# Patient Record
Sex: Female | Born: 1965 | Race: White | Hispanic: No | Marital: Single | Smoking: Never smoker
Health system: Southern US, Community
[De-identification: ages and names within clinical notes are randomized; demographics above are authoritative.]

## PROBLEM LIST (undated history)

## (undated) DIAGNOSIS — F329 Major depressive disorder, single episode, unspecified: Secondary | ICD-10-CM

## (undated) DIAGNOSIS — E079 Disorder of thyroid, unspecified: Secondary | ICD-10-CM

## (undated) DIAGNOSIS — E7800 Pure hypercholesterolemia, unspecified: Secondary | ICD-10-CM

## (undated) DIAGNOSIS — E119 Type 2 diabetes mellitus without complications: Secondary | ICD-10-CM

## (undated) DIAGNOSIS — F32A Depression, unspecified: Secondary | ICD-10-CM

## (undated) HISTORY — PX: BREAST SURGERY: SHX581

## (undated) HISTORY — PX: CHOLECYSTECTOMY: SHX55

---

## 2009-01-27 ENCOUNTER — Ambulatory Visit: Payer: Self-pay | Admitting: Interventional Radiology

## 2009-01-27 ENCOUNTER — Emergency Department (HOSPITAL_BASED_OUTPATIENT_CLINIC_OR_DEPARTMENT_OTHER): Admission: EM | Admit: 2009-01-27 | Discharge: 2009-01-27 | Payer: Self-pay | Admitting: Emergency Medicine

## 2010-12-30 LAB — POCT CARDIAC MARKERS
CKMB, poc: <1 ng/mL — ABNORMAL LOW (ref 1.0–8.0)
Troponin i, poc: <0.05 ng/mL (ref 0.00–0.09)
Troponin i, poc: <0.05 ng/mL (ref 0.00–0.09)

## 2010-12-30 LAB — CBC
HCT: 42 % (ref 36.0–46.0)
MCHC: 33.7 g/dL (ref 30.0–36.0)
MCV: 87.5 fL (ref 78.0–100.0)
Platelets: 274 10*3/uL (ref 150–400)

## 2010-12-30 LAB — BASIC METABOLIC PANEL
BUN: 12 mg/dL (ref 6–23)
CO2: 26 mEq/L (ref 19–32)
Chloride: 107 mEq/L (ref 96–112)
Creatinine, Ser: 0.7 mg/dL (ref 0.4–1.2)
Potassium: 4.1 mEq/L (ref 3.5–5.1)

## 2010-12-30 LAB — DIFFERENTIAL
Basophils Relative: 1 % (ref 0–1)
Eosinophils Absolute: 0.1 10*3/uL (ref 0.0–0.7)
Eosinophils Relative: 1 % (ref 0–5)
Monocytes Relative: 5 % (ref 3–12)
Neutrophils Relative %: 71 % (ref 43–77)

## 2013-11-20 ENCOUNTER — Encounter (HOSPITAL_BASED_OUTPATIENT_CLINIC_OR_DEPARTMENT_OTHER): Payer: Self-pay | Admitting: Emergency Medicine

## 2013-11-20 ENCOUNTER — Emergency Department (HOSPITAL_BASED_OUTPATIENT_CLINIC_OR_DEPARTMENT_OTHER): Payer: Worker's Compensation

## 2013-11-20 ENCOUNTER — Emergency Department (HOSPITAL_BASED_OUTPATIENT_CLINIC_OR_DEPARTMENT_OTHER)
Admission: EM | Admit: 2013-11-20 | Discharge: 2013-11-21 | Disposition: A | Discharge status: Home / Self Care | Payer: Worker's Compensation | Attending: Emergency Medicine | Admitting: Emergency Medicine

## 2013-11-20 DIAGNOSIS — E079 Disorder of thyroid, unspecified: Secondary | ICD-10-CM | POA: Insufficient documentation

## 2013-11-20 DIAGNOSIS — F3289 Other specified depressive episodes: Secondary | ICD-10-CM | POA: Insufficient documentation

## 2013-11-20 DIAGNOSIS — Y9289 Other specified places as the place of occurrence of the external cause: Secondary | ICD-10-CM | POA: Insufficient documentation

## 2013-11-20 DIAGNOSIS — S61411A Laceration without foreign body of right hand, initial encounter: Secondary | ICD-10-CM

## 2013-11-20 DIAGNOSIS — W268XXA Contact with other sharp object(s), not elsewhere classified, initial encounter: Secondary | ICD-10-CM | POA: Insufficient documentation

## 2013-11-20 DIAGNOSIS — F329 Major depressive disorder, single episode, unspecified: Secondary | ICD-10-CM | POA: Insufficient documentation

## 2013-11-20 DIAGNOSIS — Y990 Civilian activity done for income or pay: Secondary | ICD-10-CM | POA: Insufficient documentation

## 2013-11-20 DIAGNOSIS — Z79899 Other long term (current) drug therapy: Secondary | ICD-10-CM | POA: Insufficient documentation

## 2013-11-20 DIAGNOSIS — E7800 Pure hypercholesterolemia, unspecified: Secondary | ICD-10-CM | POA: Insufficient documentation

## 2013-11-20 DIAGNOSIS — Z23 Encounter for immunization: Secondary | ICD-10-CM | POA: Insufficient documentation

## 2013-11-20 DIAGNOSIS — E119 Type 2 diabetes mellitus without complications: Secondary | ICD-10-CM | POA: Insufficient documentation

## 2013-11-20 DIAGNOSIS — Y9389 Activity, other specified: Secondary | ICD-10-CM | POA: Insufficient documentation

## 2013-11-20 DIAGNOSIS — S61409A Unspecified open wound of unspecified hand, initial encounter: Secondary | ICD-10-CM | POA: Insufficient documentation

## 2013-11-20 HISTORY — DX: Major depressive disorder, single episode, unspecified: F32.9

## 2013-11-20 HISTORY — DX: Depression, unspecified: F32.A

## 2013-11-20 HISTORY — DX: Type 2 diabetes mellitus without complications: E11.9

## 2013-11-20 HISTORY — DX: Disorder of thyroid, unspecified: E07.9

## 2013-11-20 HISTORY — DX: Pure hypercholesterolemia, unspecified: E78.00

## 2013-11-20 MED ORDER — ACETAMINOPHEN 325 MG PO TABS
650.0000 mg | ORAL_TABLET | Freq: Once | ORAL | Status: AC
  Administered 2013-11-20: 650 mg via ORAL
  Filled 2013-11-20: qty 2

## 2013-11-20 MED ORDER — TETANUS-DIPHTH-ACELL PERTUSSIS 5-2.5-18.5 LF-MCG/0.5 IM SUSP
0.5000 mL | Freq: Once | INTRAMUSCULAR | Status: AC
  Administered 2013-11-20: 0.5 mL via INTRAMUSCULAR
  Filled 2013-11-20: qty 0.5

## 2013-11-20 NOTE — ED Notes (Signed)
Cart at bedside. 

## 2013-11-20 NOTE — ED Provider Notes (Signed)
CSN: [id number]     Arrival date & time 11/20/13  2007 History   First MD Initiated Contact with Patient 11/20/13 2037     Chief Complaint  Patient presents with  . Laceration    HPI  Valerie Day is a 48 y.o. female with a PMH of DM, depression, high cholesterol, and thyroid disease who presents to the ED for evaluation of laceration. History was provided by the patient. Around 6:00 PM today she was at work moving a wooden wine display when it slipped and cut the palmar side of her right thumb. Patient had bleeding which stopped with direct pressure. No numbness, tingling, loss of sensation, or weakness. No other injuries. Cannot confidently state that there are no foreign bodies but does not believe so. Pain controlled. Tetanus not up to date.    Past Medical History  Diagnosis Date  . Thyroid disease   . Diabetes mellitus without complication   . Depression   . High cholesterol    Past Surgical History  Procedure Laterality Date  . Cholecystectomy    . Breast surgery     No family history on file. History  Substance Use Topics  . Smoking status: Never Smoker   . Smokeless tobacco: Not on file  . Alcohol Use: No   OB History   Grav Para Term Preterm Abortions TAB SAB Ect Mult Living                 Review of Systems  Musculoskeletal: Negative for arthralgias, joint swelling and myalgias.  Skin: Positive for wound. Negative for color change.  Neurological: Negative for weakness and numbness.    Allergies  Codeine; Darvon; and Nsaids  Home Medications   Current Outpatient Rx  Name  Route  Sig  Dispense  Refill  . Escitalopram Oxalate (LEXAPRO PO)   Oral   Take by mouth.         . Levothyroxine Sodium (SYNTHROID PO)   Oral   Take by mouth.         . METFORMIN HCL PO   Oral   Take by mouth.         Marland Kitchen. PRAVASTATIN SODIUM PO   Oral   Take by mouth.          BP 145/96  Pulse 70  Temp(Src) 98.6 F (37 C) (Oral)  Resp 20  Ht 5\' 6"  (1.676 m)   Wt 210 lb (95.255 kg)  BMI 33.91 kg/m2  SpO2 98%  LMP 10/30/2013  Filed Vitals:   11/20/13 2018 11/21/13 0027  BP: 145/96 116/89  Pulse: 70 77  Temp: 98.6 F (37 C) 98.9 F (37.2 C)  TempSrc: Oral   Resp: 20 16  Height: 5\' 6"  (1.676 m)   Weight: 210 lb (95.255 kg)   SpO2: 98% 97%    Physical Exam  Nursing note and vitals reviewed. Constitutional: She is oriented to person, place, and time. She appears well-developed and well-nourished. No distress.  HENT:  Head: Normocephalic and atraumatic.  Right Ear: External ear normal.  Left Ear: External ear normal.  Mouth/Throat: Oropharynx is clear and moist.  Eyes: Conjunctivae are normal. Right eye exhibits no discharge. Left eye exhibits no discharge.  Neck: Neck supple.  Cardiovascular: Normal rate, regular rhythm and normal heart sounds.  Exam reveals no gallop and no friction rub.   No murmur heard. Radial pulses present and equal bilaterally. Capillary <2 seconds in the digits of the right hand.  Pulmonary/Chest: Effort normal and  breath sounds normal. No respiratory distress. She has no wheezes. She has no rales. She exhibits no tenderness.  Abdominal: Soft.  Musculoskeletal: She exhibits tenderness. She exhibits no edema.       Hands: 5 cm linear laceration to the right palmar thenar eminence. Bleeding controlled. No foreign bodies. No surrounding erythema, edema, or ecchymosis. Area tender to palpation. Strength 5/5 in the digits of the right hand. No tenderness to the digits on the right. No wrist tenderness.   Neurological: She is alert and oriented to person, place, and time.  Sensation intact in the right hand  Skin: Skin is warm and dry. She is not diaphoretic.    ED Course  LACERATION REPAIR Date/Time: 11/20/2013 11:45 PM Performed by: Coral Ceo K Authorized by: Jillyn Ledger Consent: Verbal consent obtained. Consent given by: patient Patient identity confirmed: verbally with patient Body area: upper  extremity Location details: right thumb Laceration length: 5 cm Foreign bodies: no foreign bodies Tendon involvement: none Nerve involvement: none Vascular damage: no Anesthesia: local infiltration Local anesthetic: lidocaine 1% without epinephrine Anesthetic total: 5 ml Patient sedated: no Preparation: Patient was prepped and draped in the usual sterile fashion. Irrigation solution: saline Irrigation method: jet lavage Amount of cleaning: standard Debridement: none Degree of undermining: none Skin closure: Ethilon Number of sutures: 5 Technique: simple Approximation: close Approximation difficulty: simple Dressing: antibiotic ointment and 4x4 sterile gauze Patient tolerance: Patient tolerated the procedure well with no immediate complications.   (including critical care time) Labs Review Labs Reviewed - No data to display Imaging Review No results found.   EKG Interpretation None      DG Hand Complete Right (Final result)  Result time: 11/20/13 23:39:53    Procedure changed from DG Hand 2 View Right       Final result by Rad Results In Interface (11/20/13 23:39:53)    Narrative:   CLINICAL DATA: Laceration.  EXAM: RIGHT HAND - COMPLETE 3+ VIEW  COMPARISON: None.  FINDINGS: No fracture or dislocation.  Soft tissue injury without radiopaque foreign body. This does not exclude non radiopaque foreign body such as small piece of wood.  IMPRESSION: No fracture or dislocation.  Soft tissue injury without radiopaque foreign body. This does not exclude non radiopaque foreign body such as small piece of wood.   Electronically Signed By: Bridgett Larsson M.D. On: 11/20/2013 23:39     MDM   Valerie Day is a 48 y.o. female with a PMH of DM, depression, high cholesterol, and thyroid disease who presents to the ED for evaluation of laceration. Laceration repaired in the ED. No visible foreign bodies or foreign bodies seen on x-ray. Patient neurovascularly  intact. Tetanus up to date. Laceration care discussed with patient.    Discharge Medication List as of 11/21/2013 12:18 AM       Final impressions: 1. Laceration of hand, right       Luiz Iron PA-C           Jillyn Ledger, PA-C 11/21/13 [phone]

## 2013-11-20 NOTE — ED Notes (Signed)
Laceration to her right hand while moving a wooden wine display. Large deep laceration noted. Bleeding controlled.

## 2013-11-21 NOTE — Discharge Instructions (Signed)

## 2013-11-21 NOTE — ED Provider Notes (Signed)
Medical screening examination/treatment/procedure(s) were performed by non-physician practitioner and as supervising physician I was immediately available for consultation/collaboration.   EKG Interpretation None        Mariska Daffin, MD 11/21/13 1650 

## 2013-11-21 NOTE — ED Notes (Signed)
Pt questioned about injury occuring at work and weather drug screening and workers compensation papers were need to be completed. Mrs Valerie Day stated that this would be handled at work by her employer and did not require assistance with either at this time.

## 2015-01-02 IMAGING — CR DG HAND COMPLETE 3+V*R*
3 series · 3 of 3 positions shown · non-contrast
Comparison: None.

CLINICAL DATA: Laceration.

EXAM:
RIGHT HAND - COMPLETE 3+ VIEW

[x hand pa right]
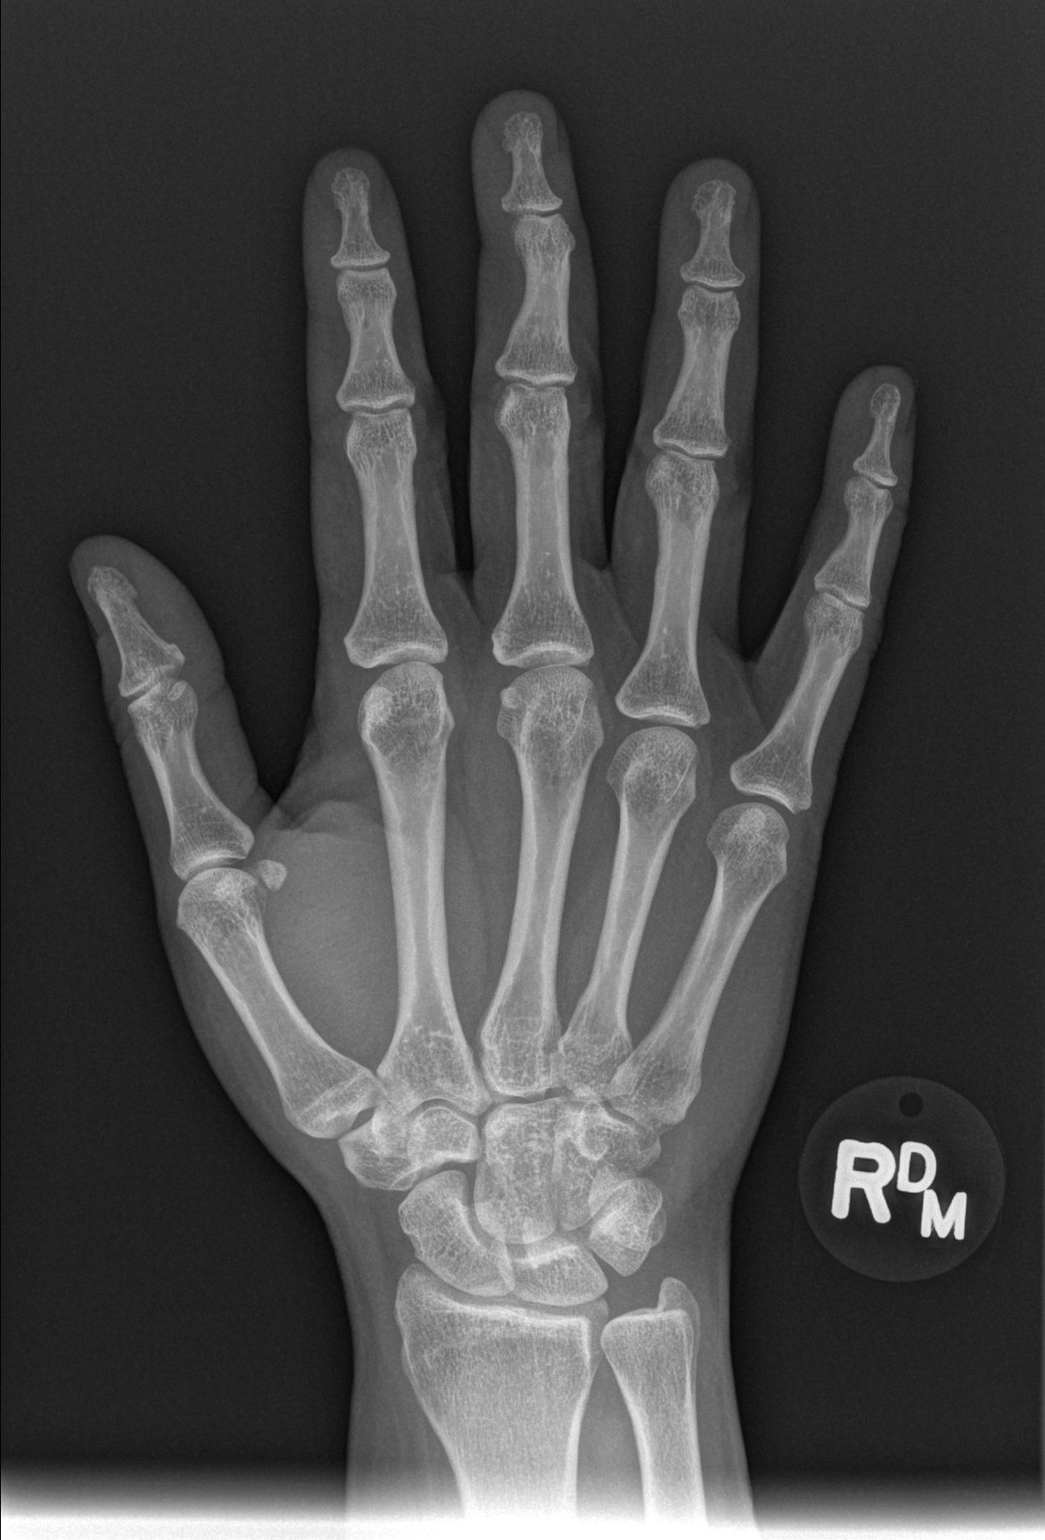

[x hand oblique right]
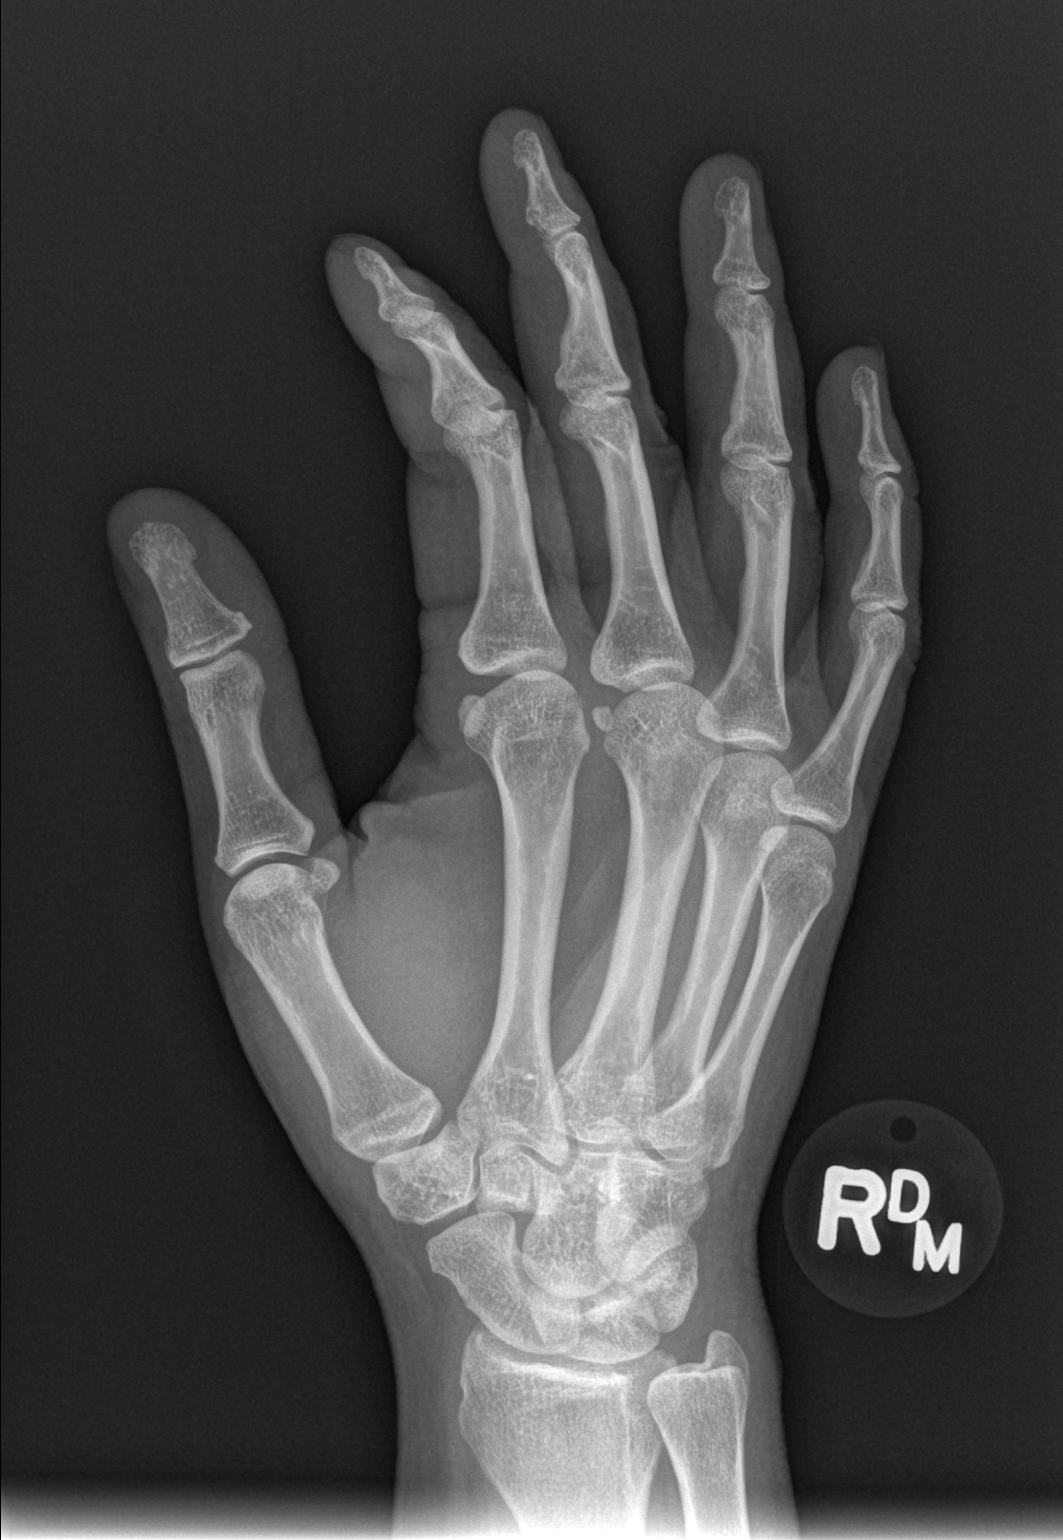

[x hand lat right]
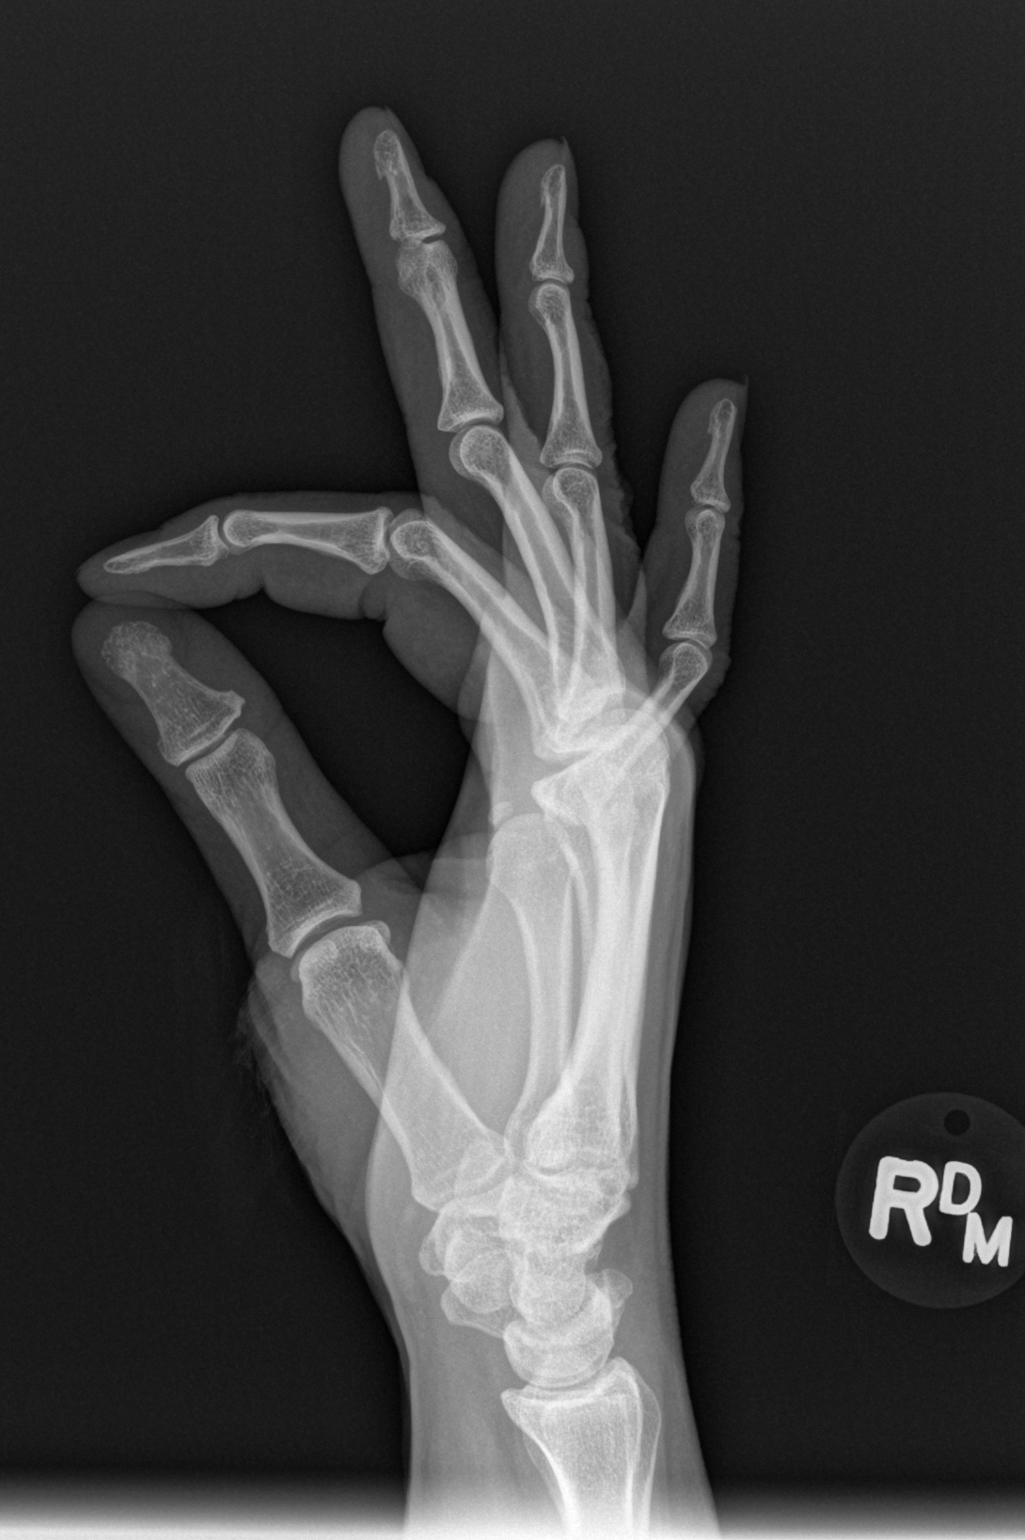

[3 of 3 positions shown; findings below may reference images not displayed]

FINDINGS: No fracture or dislocation.

Soft tissue injury without radiopaque foreign body. This does not
exclude non radiopaque foreign body such as small piece of [place].
IMPRESSION: No fracture or dislocation.

Soft tissue injury without radiopaque foreign body. This does not
exclude non radiopaque foreign body such as small piece of [place].

## 2019-11-30 ENCOUNTER — Other Ambulatory Visit (HOSPITAL_COMMUNITY): Payer: Self-pay | Admitting: General Surgery

## 2019-11-30 ENCOUNTER — Other Ambulatory Visit: Payer: Self-pay | Admitting: General Surgery

## 2019-12-06 ENCOUNTER — Ambulatory Visit (HOSPITAL_COMMUNITY): Admission: RE | Admit: 2019-12-06 | Payer: 59 | Source: Ambulatory Visit

## 2019-12-06 ENCOUNTER — Encounter (HOSPITAL_COMMUNITY): Payer: Self-pay
